# Patient Record
Sex: Male | Born: 1970 | Hispanic: No | Marital: Married
Health system: Southern US, Community
[De-identification: ages and names within clinical notes are randomized; demographics above are authoritative.]

---

## 2000-05-06 ENCOUNTER — Inpatient Hospital Stay (HOSPITAL_COMMUNITY): Admission: EM | Admit: 2000-05-06 | Discharge: 2000-05-08 | Payer: Self-pay | Admitting: Internal Medicine

## 2000-05-06 ENCOUNTER — Encounter: Payer: Self-pay | Admitting: General Surgery

## 2020-01-16 ENCOUNTER — Emergency Department (HOSPITAL_COMMUNITY): Payer: Self-pay

## 2020-01-16 ENCOUNTER — Emergency Department (HOSPITAL_COMMUNITY)
Admission: EM | Admit: 2020-01-16 | Discharge: 2020-01-16 | Disposition: A | Discharge status: Home / Self Care | Payer: Self-pay | Attending: Emergency Medicine | Admitting: Emergency Medicine

## 2020-01-16 ENCOUNTER — Ambulatory Visit (HOSPITAL_COMMUNITY): Admission: EM | Admit: 2020-01-16 | Discharge: 2020-01-16 | Payer: Self-pay

## 2020-01-16 ENCOUNTER — Other Ambulatory Visit: Payer: Self-pay

## 2020-01-16 ENCOUNTER — Encounter (HOSPITAL_COMMUNITY): Payer: Self-pay

## 2020-01-16 DIAGNOSIS — Y9289 Other specified places as the place of occurrence of the external cause: Secondary | ICD-10-CM | POA: Insufficient documentation

## 2020-01-16 DIAGNOSIS — Z20822 Contact with and (suspected) exposure to covid-19: Secondary | ICD-10-CM | POA: Insufficient documentation

## 2020-01-16 DIAGNOSIS — Y9389 Activity, other specified: Secondary | ICD-10-CM | POA: Insufficient documentation

## 2020-01-16 DIAGNOSIS — Z23 Encounter for immunization: Secondary | ICD-10-CM | POA: Insufficient documentation

## 2020-01-16 DIAGNOSIS — S68623A Partial traumatic transphalangeal amputation of left middle finger, initial encounter: Secondary | ICD-10-CM | POA: Insufficient documentation

## 2020-01-16 DIAGNOSIS — W298XXA Contact with other powered powered hand tools and household machinery, initial encounter: Secondary | ICD-10-CM | POA: Insufficient documentation

## 2020-01-16 DIAGNOSIS — S6992XA Unspecified injury of left wrist, hand and finger(s), initial encounter: Secondary | ICD-10-CM

## 2020-01-16 LAB — CBC WITH DIFFERENTIAL/PLATELET
Abs Immature Granulocytes: 0.03 10*3/uL (ref 0.00–0.07)
Basophils Absolute: 0.1 10*3/uL (ref 0.0–0.1)
Basophils Relative: 1 %
Eosinophils Absolute: 0.2 10*3/uL (ref 0.0–0.5)
Eosinophils Relative: 2 %
HCT: 42.1 % (ref 39.0–52.0)
Hemoglobin: 13.6 g/dL (ref 13.0–17.0)
Immature Granulocytes: 0 %
Lymphocytes Relative: 34 %
Lymphs Abs: 2.8 10*3/uL (ref 0.7–4.0)
MCH: 29.5 pg (ref 26.0–34.0)
MCHC: 32.3 g/dL (ref 30.0–36.0)
MCV: 91.3 fL (ref 80.0–100.0)
Monocytes Absolute: 0.6 10*3/uL (ref 0.1–1.0)
Monocytes Relative: 8 %
Neutro Abs: 4.5 10*3/uL (ref 1.7–7.7)
Neutrophils Relative %: 55 %
Platelets: 243 10*3/uL (ref 150–400)
RBC: 4.61 MIL/uL (ref 4.22–5.81)
RDW: 12.7 % (ref 11.5–15.5)
WBC: 8.3 10*3/uL (ref 4.0–10.5)
nRBC: 0 % (ref 0.0–0.2)

## 2020-01-16 LAB — COMPREHENSIVE METABOLIC PANEL
ALT: 23 U/L (ref 0–44)
AST: 27 U/L (ref 15–41)
Albumin: 4 g/dL (ref 3.5–5.0)
Alkaline Phosphatase: 68 U/L (ref 38–126)
Anion gap: 11 (ref 5–15)
BUN: 14 mg/dL (ref 6–20)
CO2: 24 mmol/L (ref 22–32)
Calcium: 9 mg/dL (ref 8.9–10.3)
Chloride: 102 mmol/L (ref 98–111)
Creatinine, Ser: 1.02 mg/dL (ref 0.61–1.24)
GFR calc non Af Amer: >60 mL/min (ref >60)
Glucose, Bld: 100 mg/dL — ABNORMAL HIGH (ref 70–99)
Potassium: 3.5 mmol/L (ref 3.5–5.1)
Sodium: 137 mmol/L (ref 135–145)
Total Bilirubin: 0.6 mg/dL (ref 0.3–1.2)
Total Protein: 7.1 g/dL (ref 6.5–8.1)

## 2020-01-16 LAB — RESPIRATORY PANEL BY RT PCR (FLU A&B, COVID)
Influenza A by PCR: NEGATIVE
Influenza B by PCR: NEGATIVE
SARS Coronavirus 2 by RT PCR: NEGATIVE

## 2020-01-16 MED ORDER — TETANUS-DIPHTH-ACELL PERTUSSIS 5-2.5-18.5 LF-MCG/0.5 IM SUSP
0.5000 mL | Freq: Once | INTRAMUSCULAR | Status: AC
  Administered 2020-01-16: 0.5 mL via INTRAMUSCULAR
  Filled 2020-01-16: qty 0.5

## 2020-01-16 MED ORDER — CEPHALEXIN 500 MG PO CAPS: 500.0000 mg | ORAL_CAPSULE | Freq: Two times a day (BID) | ORAL | 0 refills | Status: AC

## 2020-01-16 MED ORDER — MORPHINE SULFATE (PF) 4 MG/ML IV SOLN
4.0000 mg | Freq: Once | INTRAVENOUS | Status: AC
  Administered 2020-01-16: 4 mg via INTRAVENOUS
  Filled 2020-01-16: qty 1

## 2020-01-16 MED ORDER — IBUPROFEN 800 MG PO TABS: 800.0000 mg | ORAL_TABLET | Freq: Three times a day (TID) | ORAL | 0 refills | Status: AC

## 2020-01-16 MED ORDER — CEFAZOLIN SODIUM-DEXTROSE 2-4 GM/100ML-% IV SOLN
2.0000 g | Freq: Once | INTRAVENOUS | Status: AC
  Administered 2020-01-16: 2 g via INTRAVENOUS
  Filled 2020-01-16: qty 100

## 2020-01-16 MED ORDER — LIDOCAINE HCL (PF) 1 % IJ SOLN
30.0000 mL | Freq: Once | INTRAMUSCULAR | Status: AC
  Administered 2020-01-16: 30 mL
  Filled 2020-01-16: qty 30

## 2020-01-16 NOTE — ED Triage Notes (Signed)
Pt amputated the tip of his left middle finger with a table saw today, bleeding controlled. +ROM.

## 2020-01-16 NOTE — ED Notes (Signed)
Patient is being discharged from the Urgent Care and sent to the Emergency Department via personal vehicle with family member . Per Dr Delton See, patient is in need of higher level of care due to severity of laceration. Patient is aware and verbalizes understanding of plan of care. There were no vitals filed for this visit.

## 2020-01-16 NOTE — ED Notes (Signed)
Patient verbalizes understanding of discharge instructions. Opportunity for questioning and answers were provided. Armband removed by staff, pt discharged from ED ambulatory.   

## 2020-01-16 NOTE — ED Triage Notes (Signed)
Emergency Medicine Provider Triage Evaluation Note  Andrew Randall , a 49 y.o. male  was evaluated in triage.  Pt complains of left middle finger injury that occurred around 7 PM this evening while using a table saw. Unknown last tetanus vaccination.   Review of Systems  Positive: Left middle finger injury Negative: Numbness, weakness  Physical Exam  BP (!) 128/91 (BP Location: Right Arm)   Pulse 69   Temp 97.9 F (36.6 C) (Oral)   Resp 16   Ht 5\' 6"  (1.676 m)   Wt 79.4 kg   SpO2 99%   BMI 28.25 kg/m  Gen:   Awake, no distress   HEENT:  Atraumatic  Resp:  Normal effort  Cardiac:  Normal rate  MSK:   Left middle finger with avulsion along the ulnar aspect, as shown in photos Neuro:  Speech clear, sensation to light touch intact in the left middle finger  Medical Decision Making  Medically screening exam initiated at 8:41 PM.  Appropriate orders placed.  Andrew Randall was informed that the remainder of the evaluation will be completed by another provider, this initial triage assessment does not replace that evaluation, and the importance of remaining in the ED until their evaluation is complete.  Clinical Impression   DG Hand Complete Left  Result Date: 01/16/2020 CLINICAL DATA:  Left middle finger laceration saw injury EXAM: LEFT HAND - COMPLETE 3+ VIEW COMPARISON:  None. FINDINGS: There is a comminuted fracture of the ulnar aspect of the third digit with small osseous amputation of the distal tuft and soft tissue amputation. Overlying skin defect with soft tissue swelling is seen. No radiopaque foreign body. IMPRESSION: Comminuted fracture of the distal third tuft with a small osseous and soft tissue amputation. Electronically Signed   By: 03/17/2020 M.D.   On: 01/16/2020 20:43    Clinical Course as of Jan 15 2246  Thu Jan 16, 2020  2102 Asked unit secretary to contact 2103.   [SJ]  2105 Asked secretary to repage 2106.   [SJ]  2212 Spoke with Dr.  2213, on-call for hand surgery. We reviewed the patient's injury, clinical pictures, and x-ray.  We reviewed the presence of exposed bone in the wound. He recommends giving the patient a dose of IV Ancef here in the ED.  Apply Xeroform gauze with surrounding bulky dressing. Discharge patient on Keflex. Patient should come to his clinic tomorrow morning at 8 AM and he will further manage the patient.   [SJ]    Clinical Course User Index [SJ] Roney Mans, Precious Gilding 01/16/20 2247

## 2020-01-16 NOTE — ED Provider Notes (Signed)
[facility] EMERGENCY DEPARTMENT Provider Note   CSN: [id number] Arrival date & time: 01/16/20  1951     History Chief Complaint  Patient presents with  . Finger Injury    Andrew Randall is a 49 y.o. male.  Patient presents to the ED with a chief complaint of finger injury.  He states that he cut his left middle finger while using a table saw tonight at 7pm.  He complains of moderate pain. Symptoms are worsened with palpation. Nothing makes the symptoms better. He denies any treatments PTA.  Last tdap unknown.    The history is provided by the patient. No language interpreter was used.       History reviewed. No pertinent past medical history.  There are no problems to display for this patient.   History reviewed. No pertinent surgical history.     No family history on file.  Social History   Tobacco Use  . Smoking status: Not on file  Substance Use Topics  . Alcohol use: Not on file  . Drug use: Not on file    Home Medications Prior to Admission medications   Not on File    Allergies    Patient has no allergy information on record.  Review of Systems   Review of Systems  All other systems reviewed and are negative.   Physical Exam Updated Vital Signs BP 127/88   Pulse 86   Temp 97.9 F (36.6 C) (Oral)   Resp 15   Ht 5\' 6"  (1.676 m)   Wt 79.4 kg   SpO2 99%   BMI 28.25 kg/m   Physical Exam Vitals and nursing note reviewed.  Constitutional:      General: He is not in acute distress.    Appearance: He is well-developed. He is not ill-appearing.  HENT:     Head: Normocephalic and atraumatic.  Eyes:     Conjunctiva/sclera: Conjunctivae normal.  Cardiovascular:     Rate and Rhythm: Normal rate.  Pulmonary:     Effort: Pulmonary effort is normal. No respiratory distress.  Abdominal:     General: There is no distension.  Musculoskeletal:     Cervical back: Neck supple.     Comments: Moves all extremities  Skin:     General: Skin is warm and dry.     Comments: Left middle finger injury as pictured  Neurological:     Mental Status: He is alert and oriented to person, place, and time.  Psychiatric:        Mood and Affect: Mood normal.        Behavior: Behavior normal.           ED Results / Procedures / Treatments   Labs (all labs ordered are listed, but only abnormal results are displayed) Labs Reviewed  COMPREHENSIVE METABOLIC PANEL - Abnormal; Notable for the following components:      Result Value   Glucose, Bld 100 (*)    All other components within normal limits  RESPIRATORY PANEL BY RT PCR (FLU A&B, COVID)  CBC WITH DIFFERENTIAL/PLATELET    EKG None  Radiology DG Hand Complete Left  Result Date: 01/16/2020 CLINICAL DATA:  Left middle finger laceration saw injury EXAM: LEFT HAND - COMPLETE 3+ VIEW COMPARISON:  None. FINDINGS: There is a comminuted fracture of the ulnar aspect of the third digit with small osseous amputation of the distal tuft and soft tissue amputation. Overlying skin defect with soft tissue swelling is seen. No radiopaque foreign  body. IMPRESSION: Comminuted fracture of the distal third tuft with a small osseous and soft tissue amputation. Electronically Signed   By: Jonna Clark M.D.   On: 01/16/2020 20:43    Procedures Wound repair  Date/Time: 01/16/2020 11:04 PM Performed by: Roxy Horseman, PA-C Authorized by: Roxy Horseman, PA-C  Preparation: Patient was prepped and draped in the usual sterile fashion. Local anesthesia used: yes Anesthesia: digital block  Anesthesia: Local anesthesia used: yes Local Anesthetic: lidocaine 1% without epinephrine Anesthetic total: 4 mL  Sedation: Patient sedated: no  Patient tolerance: patient tolerated the procedure well with no immediate complications Comments: Digital block to allow for irrigation.      (including critical care time)  Medications Ordered in ED Medications  ceFAZolin (ANCEF) IVPB 2g/100  mL premix (2 g Intravenous New Bag/Given 01/16/20 2232)  Tdap (BOOSTRIX) injection 0.5 mL (0.5 mLs Intramuscular Given 01/16/20 2138)  morphine 4 MG/ML injection 4 mg (4 mg Intravenous Given 01/16/20 2229)  lidocaine (PF) (XYLOCAINE) 1 % injection 30 mL (30 mLs Infiltration Given 01/16/20 2243)    ED Course  I have reviewed the triage vital signs and the nursing notes.  Pertinent labs & imaging results that were available during my care of the patient were reviewed by me and considered in my medical decision making (see chart for details).  Clinical Course as of Jan 15 2257  Thu Jan 16, 2020  2102 Asked unit secretary to contact Hydrographic surveyor.   [SJ]  2105 Asked secretary to repage Hydrographic surveyor.   [SJ]  2212 Spoke with Dr. Roney Mans, on-call for hand surgery. We reviewed the patient's injury, clinical pictures, and x-ray.  We reviewed the presence of exposed bone in the wound. He recommends giving the patient a dose of IV Ancef here in the ED.  Apply Xeroform gauze with surrounding bulky dressing. Discharge patient on Keflex. Patient should come to his clinic tomorrow morning at 8 AM and he will further manage the patient.   [SJ]    Clinical Course User Index [SJ] Joy, Hillard Danker, PA-C   MDM Rules/Calculators/A&P                          Patient here with partial amputation.  Open tuft fracture.  Joy, PA-C discussed the case with Dr. Roney Mans with hand, who recommendations are listed above.  Will see in clinic tomorrow morning at 8am.  I discussed the plan of care with the patient, who is agreeable.  I anesthetized the finger and copious irrigated the wound.  Bacitracin, xeroform, and bulky dressing applied.  Tdap updated.  Final Clinical Impression(s) / ED Diagnoses Final diagnoses:  Injury of finger of left hand, initial encounter    Rx / DC Orders ED Discharge Orders         Ordered    cephALEXin (KEFLEX) 500 MG capsule  2 times daily        01/16/20 2316    ibuprofen  (ADVIL) 800 MG tablet  3 times daily        01/16/20 2316           Roxy Horseman, PA-C 01/16/20 2320    Charlynne Pander, MD 01/17/20 [phone]

## 2021-03-18 IMAGING — CR DG HAND COMPLETE 3+V*L*
3 series · 3 of 3 positions shown · non-contrast
Comparison: None.

CLINICAL DATA: Left middle finger laceration saw injury

EXAM:
LEFT HAND - COMPLETE 3+ VIEW

[hand pa]
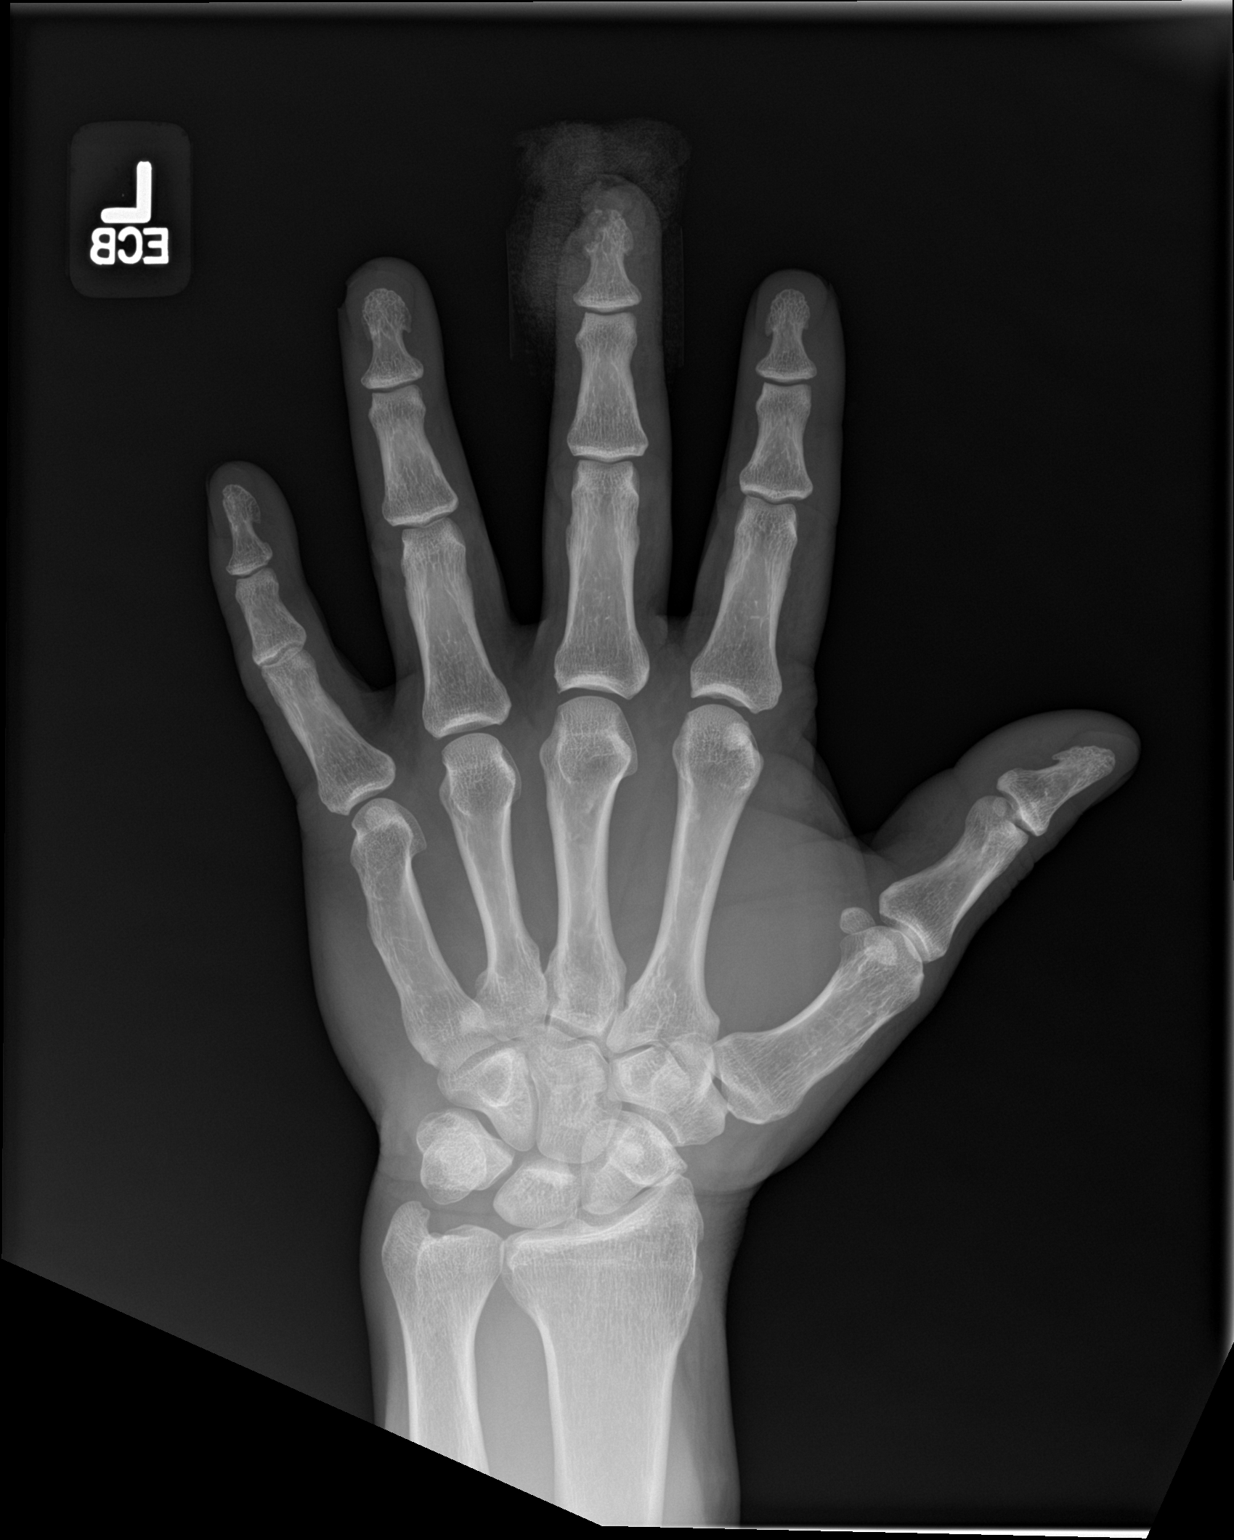

[hand obl]
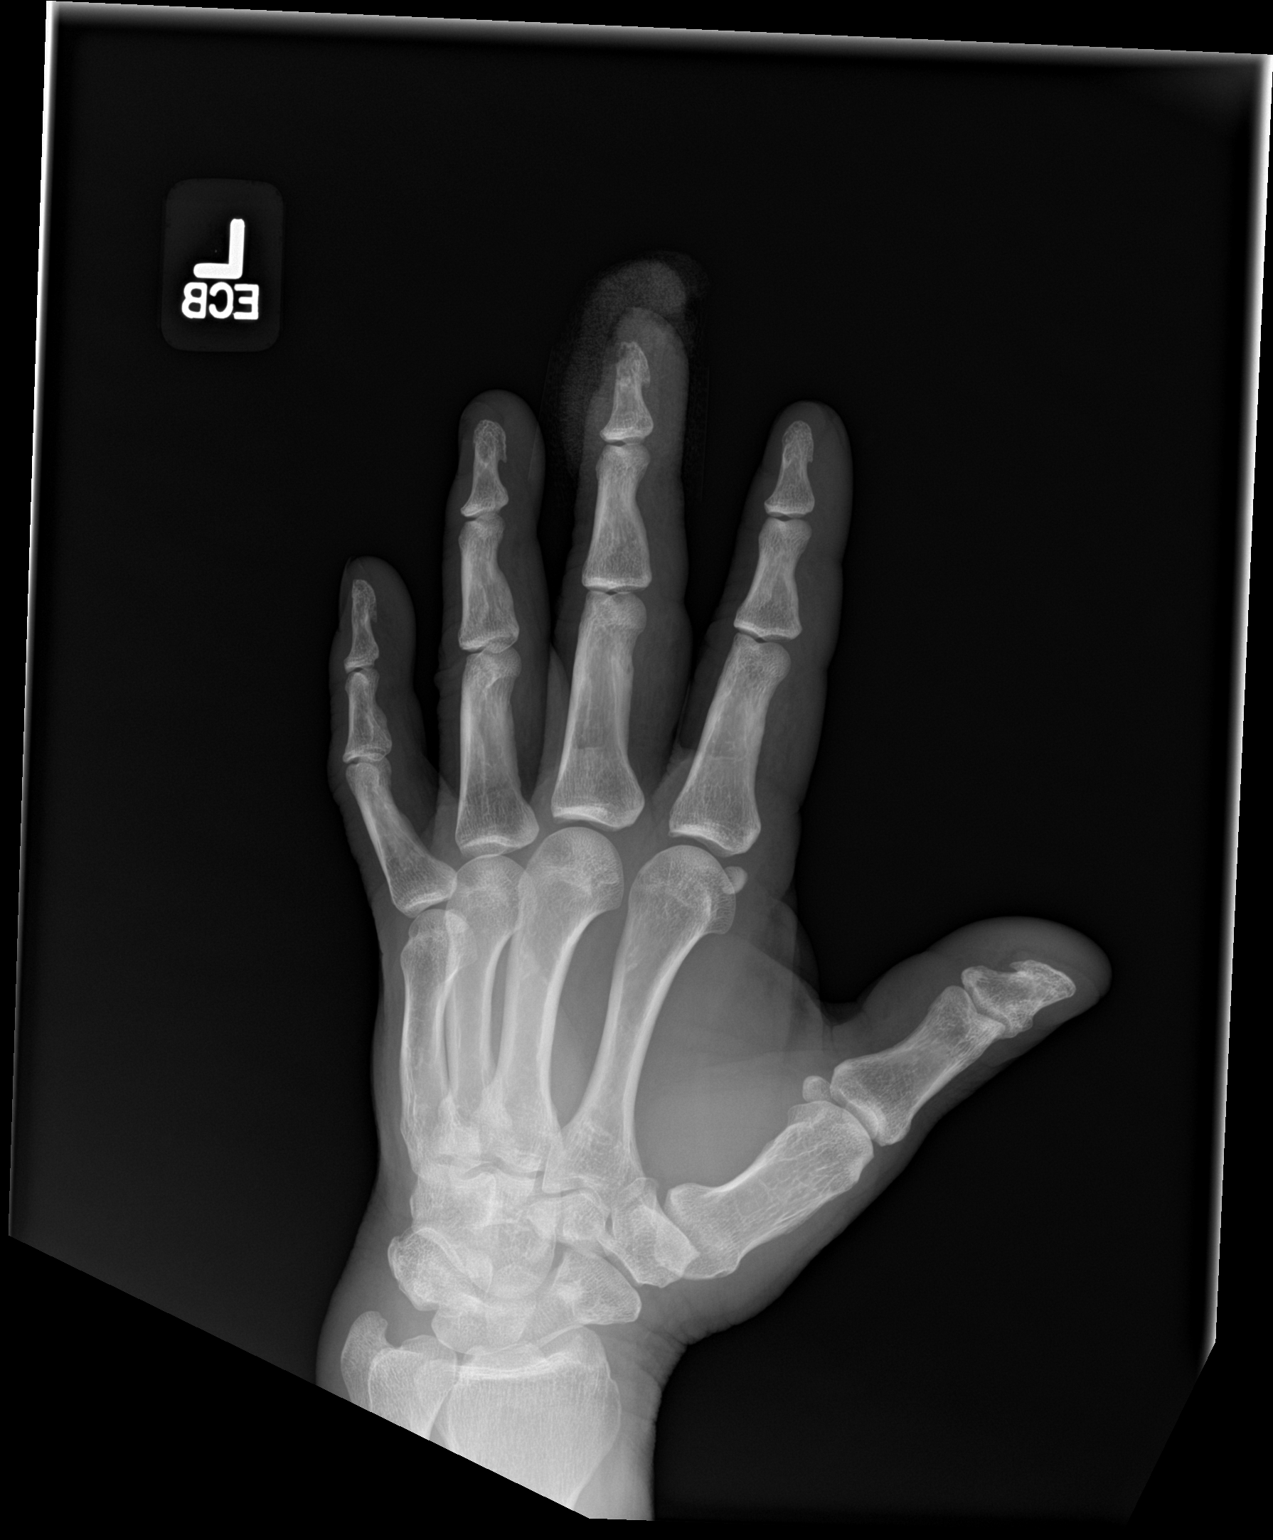

[hand lat]
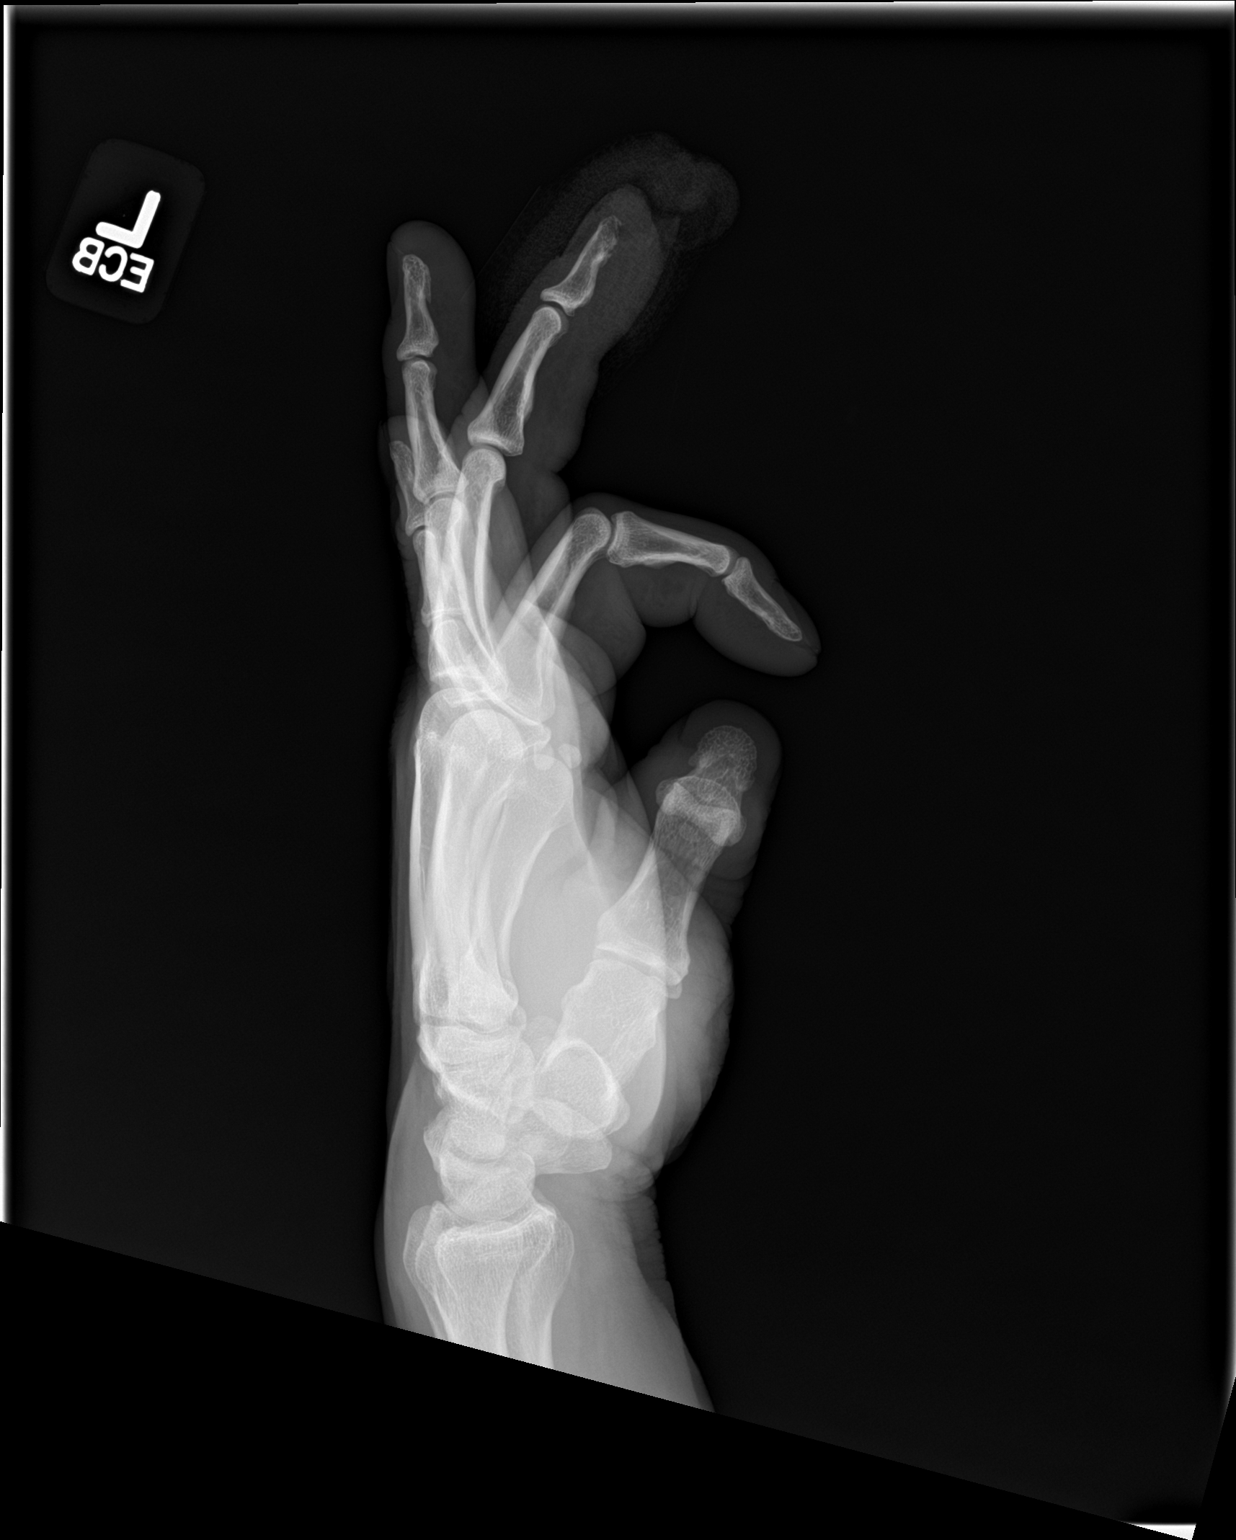

[3 of 3 positions shown; findings below may reference images not displayed]

FINDINGS: There is a comminuted fracture of the ulnar aspect of the third
digit with small osseous amputation of the distal tuft and soft
tissue amputation. Overlying skin defect with soft tissue swelling
is seen. No radiopaque foreign body.
IMPRESSION: Comminuted fracture of the distal third tuft with a small osseous
and soft tissue amputation.
